# Patient Record
Sex: Male | Born: 2000 | Race: Black or African American | Hispanic: No | Marital: Single
Health system: Southern US, Community
[De-identification: ages and names within clinical notes are randomized; demographics above are authoritative.]

## PROBLEM LIST (undated history)

## (undated) HISTORY — PX: TYMPANOSTOMY TUBE PLACEMENT: SHX32

---
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
## 2019-01-09 IMAGING — DX DG CHEST 2V
2 series · 2 of 2 positions shown · non-contrast
Comparison: August 08, 2012

CLINICAL DATA: Chest pain

EXAM:
CHEST  2 VIEW

[chest pa]
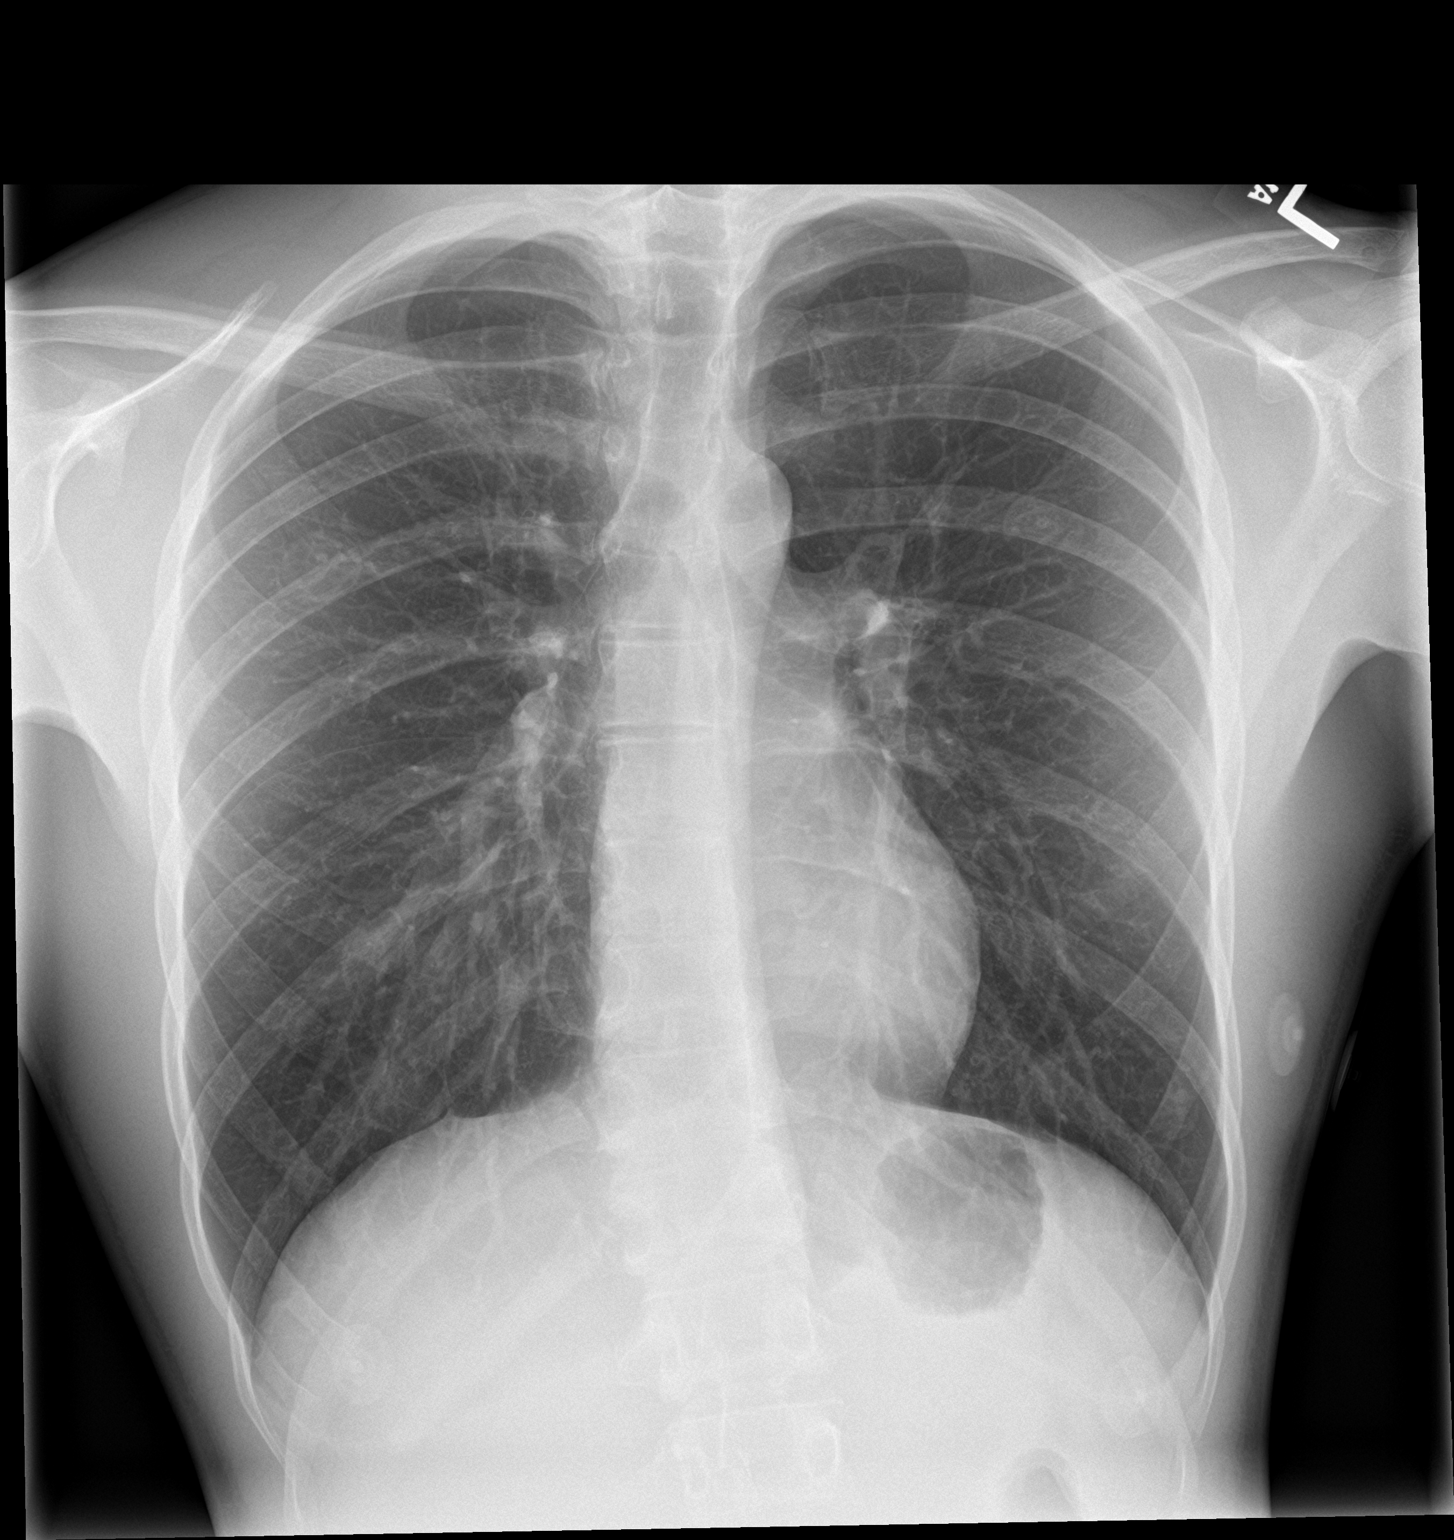

[chest lat]
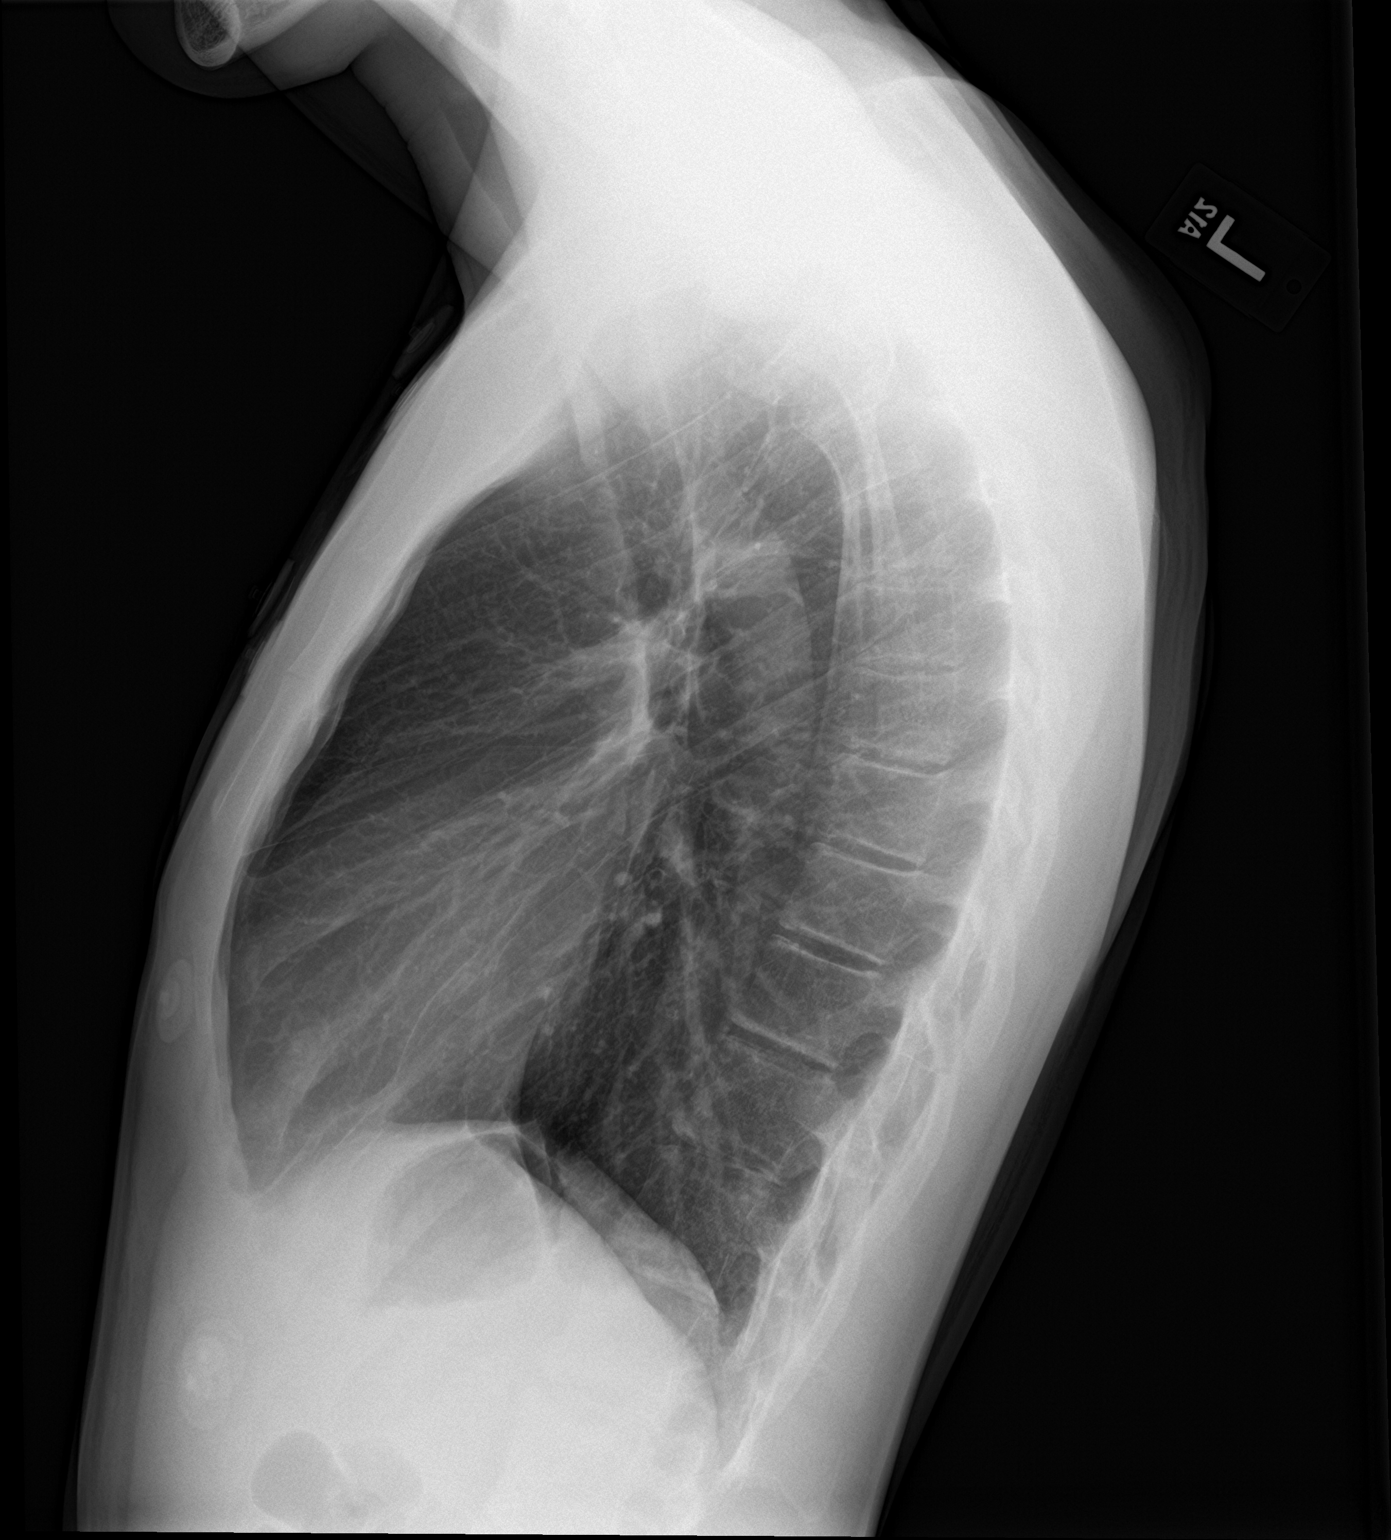

[2 of 2 positions shown; findings below may reference images not displayed]

FINDINGS: Lungs are clear. Heart size and pulmonary vascularity are normal. No
adenopathy. No bone lesions. No pneumothorax.
IMPRESSION: No edema or consolidation.

## 2019-02-16 ENCOUNTER — Ambulatory Visit: Payer: Medicaid Other | Admitting: Pediatrics

## 2019-02-18 ENCOUNTER — Other Ambulatory Visit (HOSPITAL_COMMUNITY)
Admission: RE | Admit: 2019-02-18 | Discharge: 2019-02-18 | Disposition: A | Discharge status: Home / Self Care | Payer: Medicaid Other | Source: Ambulatory Visit | Attending: Pediatrics | Admitting: Pediatrics

## 2019-02-18 ENCOUNTER — Other Ambulatory Visit: Payer: Self-pay

## 2019-02-18 ENCOUNTER — Encounter: Payer: Self-pay | Admitting: Pediatrics

## 2019-02-18 ENCOUNTER — Ambulatory Visit (INDEPENDENT_AMBULATORY_CARE_PROVIDER_SITE_OTHER): Payer: Self-pay | Admitting: Pediatrics

## 2019-02-18 VITALS — BP 118/60 | HR 91 | Ht 69.29 in | Wt 127.4 lb

## 2019-02-18 DIAGNOSIS — Z0000 Encounter for general adult medical examination without abnormal findings: Secondary | ICD-10-CM

## 2019-02-18 DIAGNOSIS — Z113 Encounter for screening for infections with a predominantly sexual mode of transmission: Secondary | ICD-10-CM

## 2019-02-18 DIAGNOSIS — Z68.41 Body mass index (BMI) pediatric, 5th percentile to less than 85th percentile for age: Secondary | ICD-10-CM

## 2019-02-18 LAB — POCT RAPID HIV: Rapid HIV, POC: NEGATIVE

## 2019-02-18 NOTE — Patient Instructions (Addendum)
Please choose an adult medical provider in [facility] or Internal Medicine for care before your next physical is due in December 2021  [facility] is a choice that uses this same electronic record, so transfer is easy and all notes are shared. If you select a doctor outside of the [facility] system, they can easily request records.  I recommend you get a flu vaccine. Our vaccines are not available for age 19 and older; however, locations like [facility], [facility] and other pharmacies have vaccine at low cost or covered by your insurance.  Please let me know if you need any paperwork done for work application before changing to adult medicine.

## 2019-02-18 NOTE — Progress Notes (Signed)
Adolescent Well Care Visit Turner Baillie is a 18 y.o. male who is here for well care.    PCP:  Maree Erie, MD   History was provided by the patient.  Confidentiality was discussed with the patient and, if applicable, with caregiver as well. Patient's personal or confidential phone number: [phone]   Current Issues: Current concerns include he is doing well.   Nutrition: Nutrition/Eating Behaviors: eats a healthful variety Adequate calcium in diet?: milk in cereal and eats cheese Supplements/ Vitamins: no  Exercise/ Media: Play any Sports?/ Exercise: no significant exercise Screen Time:  > 2 hours-counseling provided Media Rules or Monitoring?: no  Sleep:  Sleep: sleeping ok 11/midnight and up between 7/8 am  Social Screening: Lives with:  Parents and brother Parental relations:  good Activities, Work, and Regulatory affairs officer?: helpful at home; no outside job at present Concerns regarding behavior with peers?  no Stressors of note: no  Education: Graduated high school last spring.  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   Confidential Social History: Tobacco?  no Secondhand smoke exposure?  no Drugs/ETOH?  no  Sexually Active?  yes but not recently  Pregnancy Prevention: not known  Safe at home, in school & in relationships?  Yes Safe to self?  Yes   Screenings: Patient has a dental home: yes  The patient completed the Rapid Assessment for Adolescent Preventive Services screening questionnaire and the following topics were identified as risk factors and discussed: exercise  In addition, the following topics were discussed as part of anticipatory guidance healthy eating, condom use and birth control.  PHQ-9 completed and results indicated low risk with score of 0; no self-harm ideation.  Physical Exam:  Vitals:   02/18/19 1332  BP: 118/60  Pulse: 91  SpO2: 97%  Weight: 127 lb 6.4 oz (57.8 kg)  Height: 5' 9.29" (1.76 m)    BP 118/60 (BP Location: Right Arm, Patient Position: Sitting)   Pulse 91   Ht 5' 9.29" (1.76 m)   Wt 127 lb 6.4 oz (57.8 kg)   SpO2 97%   BMI 18.66 kg/m  Body mass index: body mass index is 18.66 kg/m. Blood pressure percentiles are not available for patients who are 18 years or older.   Hearing Screening   125Hz  250Hz  500Hz  1000Hz  2000Hz  3000Hz  4000Hz  6000Hz  8000Hz   Right ear:   20 20 20  20     Left ear:   20 20 20  20       Visual Acuity Screening   Right eye Left eye Both eyes  Without correction: 20/20 20/20 20/20   With correction:       General Appearance:   alert, oriented, no acute distress  HENT: Normocephalic, no obvious abnormality, conjunctiva clear  Mouth:   Normal appearing teeth, no obvious discoloration, dental caries, or dental caps  Neck:   Supple; thyroid: no enlargement, symmetric, no tenderness/mass/nodules  Chest normal  Lungs:   Clear to auscultation bilaterally, normal work of breathing  Heart:   Regular rate and rhythm, S1 and S2 normal, no murmurs;   Abdomen:   Soft, non-tender, no mass, or organomegaly  GU genitalia not examined  Musculoskeletal:   Tone and strength strong and symmetrical, all extremities               Lymphatic:   No cervical adenopathy  Skin/Hair/Nails:   Skin warm, dry and intact, no rashes, no bruises or petechiae  Neurologic:   Strength, gait,  and coordination normal and age-appropriate     Assessment and Plan:  1. Encounter for general adult medical examination without abnormal findings Age appropriate anticipatory guidance provided.  Hearing screening result:normal Vision screening result: normal   Discussed transition to adult care. Advised seasonal flu vaccine from community source (ex: pharmacy) due to overage for Vaccines for Children's program at this office.  2. Body mass index, pediatric, 5th percentile to less than 85th percentile for age Normal BMI for age. Reviewed with patient; concern his BMI is  dropping. Advised on healthy eating habits.  3. Routine screening for STI (sexually transmitted infection) Will follow up as indicated. - GC/Chlamydia [facility] - for urine and other sample types - POC Rapid HIV  Advised annual wellness visit and prn acute care. Lurlean Leyden, MD

## 2019-02-21 LAB — URINE CYTOLOGY ANCILLARY ONLY
Chlamydia: NEGATIVE
Comment: NEGATIVE
Comment: NORMAL
Neisseria Gonorrhea: NEGATIVE

## 2020-02-20 ENCOUNTER — Other Ambulatory Visit: Payer: Self-pay

## 2020-02-20 ENCOUNTER — Ambulatory Visit (INDEPENDENT_AMBULATORY_CARE_PROVIDER_SITE_OTHER): Payer: Medicaid Other | Admitting: Pediatrics

## 2020-02-20 ENCOUNTER — Other Ambulatory Visit (HOSPITAL_COMMUNITY)
Admission: RE | Admit: 2020-02-20 | Discharge: 2020-02-20 | Disposition: A | Discharge status: Home / Self Care | Payer: Medicaid Other | Source: Ambulatory Visit | Attending: Pediatrics | Admitting: Pediatrics

## 2020-02-20 ENCOUNTER — Encounter: Payer: Self-pay | Admitting: Pediatrics

## 2020-02-20 VITALS — BP 118/70 | Ht 70.5 in | Wt 139.4 lb

## 2020-02-20 DIAGNOSIS — Z113 Encounter for screening for infections with a predominantly sexual mode of transmission: Secondary | ICD-10-CM | POA: Diagnosis not present

## 2020-02-20 DIAGNOSIS — Z681 Body mass index (BMI) 19 or less, adult: Secondary | ICD-10-CM | POA: Diagnosis not present

## 2020-02-20 DIAGNOSIS — Z0000 Encounter for general adult medical examination without abnormal findings: Secondary | ICD-10-CM

## 2020-02-20 DIAGNOSIS — Z7187 Encounter for pediatric-to-adult transition counseling: Secondary | ICD-10-CM

## 2020-02-20 DIAGNOSIS — Z7185 Encounter for immunization safety counseling: Secondary | ICD-10-CM

## 2020-02-20 LAB — POCT RAPID HIV: Rapid HIV, POC: NEGATIVE

## 2020-02-20 NOTE — Patient Instructions (Addendum)
Overall health looks good.  Please check your preferred pharmacy for your seasonal flu anc COVID vaccines  We have COVID vaccine, also - No flu vaccine at our office for over age 20 years. Call us if you would like COVID vaccine here.  I will send a referral to [facility] to assume your care now that you are an adult. Let me know if any difficulties.  [facility] Address: [address] [place], [place], [place] [address] Phone: [phone]

## 2020-02-20 NOTE — Progress Notes (Signed)
Adolescent Well Care Visit Donald Chen is a 19 y.o. male who is here for well care.    PCP:  Maree Erie, MD   History was provided by the patient.  Confidentiality was discussed with the patient and, if applicable, with caregiver as well. Patient's personal or confidential phone number: [phone]   Current Issues: Current concerns include he is doing well; no health concerns today.   Nutrition: Nutrition/Eating Behaviors: eating well Adequate calcium in diet?: milk in diet Supplements/ Vitamins: none  Exercise/ Media: Play any Sports?/ Exercise: moving a lot and lifting at work Screen Time:  Lots - videos and games in spare time [employer] or Monitoring?: adult patient  Sleep:  Sleep: 12:30 am to 11:30/noon (home from work at 10:30 pm)  Social Screening: Lives with:  Parents and siblings Parental relations:  good Activities, Work, and Regulatory affairs officer?: employed at [employer] for 4-5 days a week Concerns regarding behavior with peers?  no Stressors of note: no  Education: School Name: graduated [employer] Interested in Administrator, sports  Confidential Social History: Tobacco?  no Secondhand smoke exposure?  no Drugs/ETOH?  no  Sexually Active?  no   Pregnancy Prevention: abstinence  Safe at home, in school & in relationships?  Yes Safe to self?  Yes   Screenings: Patient has a dental home:  [facility] for general care and has orthodontist for his braces  The patient completed the Rapid Assessment for Adolescent Preventive Services screening questionnaire and the following topics were identified as risk factors and discussed: no problems identified  In addition, the following topics were discussed as part of anticipatory guidance healthy eating, condom use, birth control and education.  PHQ-9 completed and results indicated low risk with score of 0; no self harm ideation  Physical Exam:  Vitals:   02/20/20 0918  BP: 118/70  Weight: 139 lb 6.4 oz (63.2 kg)  Height: 5' 10.5"  (1.791 m)   BP 118/70   Ht 5' 10.5" (1.791 m)   Wt 139 lb 6.4 oz (63.2 kg)   BMI 19.72 kg/m  Body mass index: body mass index is 19.72 kg/m. Growth percentile Smart[employer] can only be used for patients less than 42 years old.   Hearing Screening   Method: Audiometry   125Hz  250Hz  500Hz  1000Hz  2000Hz  3000Hz  4000Hz  6000Hz  8000Hz   Right ear:   20 20 20  20     Left ear:   20 20 20  20       Visual Acuity Screening   Right eye Left eye Both eyes  Without correction: 20/20 20/20 20/20   With correction:       General Appearance:   alert, oriented, no acute distress; slender  HENT: Normocephalic, no obvious abnormality, conjunctiva clear  Mouth:   Normal appearing teeth, no obvious discoloration, dental caries, or dental caps  Neck:   Supple; thyroid: no enlargement, symmetric, no tenderness/mass/nodules  Chest Normal male  Lungs:   Clear to auscultation bilaterally, normal work of breathing  Heart:   Regular rate and rhythm, S1 and S2 normal, no murmurs;   Abdomen:   Soft, non-tender, no mass, or organomegaly  GU genitalia not examined  Musculoskeletal:   Tone and strength strong and symmetrical, all extremities               Lymphatic:   No cervical adenopathy  Skin/Hair/Nails:   Skin warm, dry and intact, no rashes, no bruises or petechiae; tattoo on right forearm ( memorial to grandmother)  Neurologic:   Strength,  gait, and coordination normal and age-appropriate     Assessment and Plan:   1. Encounter for general adult medical examination without abnormal findings   2. Body mass index (BMI) 19.9 or less, adult   3. Routine screening for STI (sexually transmitted infection)   4. 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  He declined COVID vaccine today. Encouraged him to see flu vaccine in  community (overage for this office) and consider COVID vaccine in community or at our office.  Counseled on transition to adult care.  He consented to referral to family medicine. Wellness visit due annually.  PRN acute care.  Orders Placed This Encounter  Procedures  . Ambulatory referral to [facility]  . POCT Rapid HIV   Maree Erie, MD

## 2020-02-21 LAB — URINE CYTOLOGY ANCILLARY ONLY
Chlamydia: NEGATIVE
Comment: NEGATIVE
Comment: NORMAL
Neisseria Gonorrhea: NEGATIVE

## 2023-07-12 ENCOUNTER — Encounter (HOSPITAL_COMMUNITY): Payer: Self-pay

## 2023-07-12 ENCOUNTER — Ambulatory Visit (HOSPITAL_COMMUNITY)
Admission: EM | Admit: 2023-07-12 | Discharge: 2023-07-12 | Disposition: A | Discharge status: Home / Self Care | Attending: Emergency Medicine | Admitting: Emergency Medicine

## 2023-07-12 DIAGNOSIS — J029 Acute pharyngitis, unspecified: Secondary | ICD-10-CM | POA: Diagnosis present

## 2023-07-12 LAB — POCT RAPID STREP A (OFFICE): Rapid Strep A Screen: NEGATIVE

## 2023-07-12 MED ORDER — CETIRIZINE HCL 10 MG PO TABS: 10.0000 mg | ORAL_TABLET | Freq: Every day | ORAL | 2 refills | Status: AC

## 2023-07-12 MED ORDER — LIDOCAINE VISCOUS HCL 2 % MT SOLN: 15.0000 mL | OROMUCOSAL | 0 refills | Status: AC | PRN

## 2023-07-12 NOTE — ED Triage Notes (Signed)
 Patient here today with c/o ST and a little runny nose X 2 days. Hurts to swallow. He has been using a sore throat spray and taking Advil  with some relief. No known sick contacts.

## 2023-07-12 NOTE — Discharge Instructions (Addendum)
 Ibuprofen  can be alternated with tylenol every 4-6 hours. Make sure you are drinking lots of fluids! Try lidocaine gargle and spit to numb the throat  We will call you if anything returns on your throat culture (1-3 days)  I also recommend zyrtec daily and benadryl nightly for runny nose and post nasal drip

## 2023-07-12 NOTE — ED Provider Notes (Signed)
 [facility]    CSN: [id number] Arrival date & time: 07/12/23  1054      History   Chief Complaint Chief Complaint  Patient presents with   Sore Throat    HPI Tavares Kiyabu is a 23 y.o. male.  Here with 2 day history of runny nose and sore throat 8/10 pain with swallowing No fever, cough, rash, abd pain, NVD Has tried advil  and sore throat spray which help  Possible sick contacts at work  History reviewed. No pertinent past medical history.  Patient Active Problem List   Diagnosis Date Noted   Keloid 01/11/2015   Eczema 05/14/2010    Past Surgical History:  Procedure Laterality Date   TYMPANOSTOMY TUBE PLACEMENT     age 51 year       Home Medications    Prior to Admission medications   Medication Sig Start Date End Date Taking? Authorizing Provider  cetirizine (ZYRTEC ALLERGY) 10 MG tablet Take 1 tablet (10 mg total) by mouth daily. 07/12/23  Yes Parisha Beaulac, PA-C  lidocaine (XYLOCAINE) 2 % solution Use as directed 15 mLs in the mouth or throat every 3 (three) hours as needed for mouth pain. Swish/gargle and spit out 07/12/23  Yes Zeniya Lapidus, Ivette Marks, PA-C    Family History Family History  Problem Relation Age of Onset   Depression Mother     Social History Social History   Tobacco Use   Smoking status: Passive Smoke Exposure - Never Smoker   Smokeless tobacco: Never   Tobacco comments:    mom smokes inside the house  Substance Use Topics   Alcohol use: No   Drug use: No     Allergies   Patient has no known allergies.   Review of Systems Review of Systems Per HPI  Physical Exam Triage Vital Signs ED Triage Vitals  Encounter Vitals Group     BP 07/12/23 1126 121/82     Systolic BP Percentile --      Diastolic BP Percentile --      Pulse Rate 07/12/23 1126 85     Resp 07/12/23 1126 16     Temp 07/12/23 1126 98.6 F (37 C)     Temp Source 07/12/23 1126 Oral     SpO2 07/12/23 1126 95 %     Weight 07/12/23 1126 145 lb (65.8 kg)      Height 07/12/23 1126 5\' 11"  (1.803 m)     Head Circumference --      Peak Flow --      Pain Score 07/12/23 1127 8     Pain Loc --      Pain Education --      Exclude from Growth Chart --    No data found.  Updated Vital Signs BP 121/82 (BP Location: Left Arm)   Pulse 85   Temp 98.6 F (37 C) (Oral)   Resp 16   Ht 5\' 11"  (1.803 m)   Wt 145 lb (65.8 kg)   SpO2 95%   BMI 20.22 kg/m   Physical Exam Vitals and nursing note reviewed.  Constitutional:      General: He is not in acute distress.    Appearance: He is not ill-appearing or diaphoretic.  HENT:     Right Ear: Tympanic membrane and ear canal normal.     Left Ear: Tympanic membrane and ear canal normal.     Nose: Rhinorrhea present. No congestion.     Mouth/Throat:     Mouth: Mucous membranes  are moist.     Pharynx: Oropharynx is clear. Posterior oropharyngeal erythema (mild) and uvula swelling present.     Tonsils: No tonsillar exudate or tonsillar abscesses. 2+ on the right. 2+ on the left.     Comments: 2+ tonsils bilaterally with mild erythema.  There is no exudate.  Uvula mildly swollen.  There is copious postnasal drip noted.  Phonation is normal, tolerates secretions Eyes:     Conjunctiva/sclera: Conjunctivae normal.  Cardiovascular:     Rate and Rhythm: Normal rate and regular rhythm.     Pulses: Normal pulses.     Heart sounds: Normal heart sounds.  Pulmonary:     Effort: Pulmonary effort is normal.     Breath sounds: Normal breath sounds.  Musculoskeletal:     Cervical back: Normal range of motion.  Lymphadenopathy:     Cervical: No cervical adenopathy.  Skin:    General: Skin is warm and dry.  Neurological:     Mental Status: He is alert and oriented to person, place, and time.      UC Treatments / Results  Labs (all labs ordered are listed, but only abnormal results are displayed) Labs Reviewed  CULTURE, GROUP A STREP [facility])  POCT RAPID STREP A (OFFICE)    EKG  Radiology No results  found.  Procedures Procedures (including critical care time)  Medications Ordered in UC Medications - No data to display  Initial Impression / Assessment and Plan / UC Course  I have reviewed the triage vital signs and the nursing notes.  Pertinent labs & imaging results that were available during my care of the patient were reviewed by me and considered in my medical decision making (see chart for details).  Afebrile in clinic, well appearing Rapid strep negative, will culture Discussed supportive care at home. Continue ibuprofen , try lidocaine gargle, zyrtec/benadryl. If culture requires different treatment will call patient. Other return precautions. Patient agrees to plan, no questions   Final Clinical Impressions(s) / UC Diagnoses   Final diagnoses:  Sore throat     Discharge Instructions      Ibuprofen  can be alternated with tylenol every 4-6 hours. Make sure you are drinking lots of fluids! Try lidocaine gargle and spit to numb the throat  We will call you if anything returns on your throat culture (1-3 days)  I also recommend zyrtec daily and benadryl nightly for runny nose and post nasal drip      ED Prescriptions     Medication Sig Dispense Auth. Provider   lidocaine (XYLOCAINE) 2 % solution Use as directed 15 mLs in the mouth or throat every 3 (three) hours as needed for mouth pain. Swish/gargle and spit out 100 mL Amera Banos, PA-C   cetirizine (ZYRTEC ALLERGY) 10 MG tablet Take 1 tablet (10 mg total) by mouth daily. 30 tablet Ramonita Koenig, Ivette Marks, PA-C      PDMP not reviewed this encounter.   Newton Barer 07/12/23 1339

## 2023-07-14 LAB — CULTURE, GROUP A STREP (THRC)
